# Patient Record
Sex: Male | Born: 1940 | Race: White | Hispanic: No | Marital: Married
Health system: Southern US, Community
[De-identification: ages and names within clinical notes are randomized; demographics above are authoritative.]

---

## 2015-06-22 DIAGNOSIS — M5137 Other intervertebral disc degeneration, lumbosacral region: Secondary | ICD-10-CM | POA: Diagnosis not present

## 2015-06-22 DIAGNOSIS — M47816 Spondylosis without myelopathy or radiculopathy, lumbar region: Secondary | ICD-10-CM | POA: Diagnosis not present

## 2015-06-22 DIAGNOSIS — M5442 Lumbago with sciatica, left side: Secondary | ICD-10-CM | POA: Diagnosis not present

## 2015-06-24 DIAGNOSIS — M150 Primary generalized (osteo)arthritis: Secondary | ICD-10-CM | POA: Diagnosis not present

## 2015-06-24 DIAGNOSIS — N183 Chronic kidney disease, stage 3 (moderate): Secondary | ICD-10-CM | POA: Diagnosis not present

## 2015-06-24 DIAGNOSIS — I129 Hypertensive chronic kidney disease with stage 1 through stage 4 chronic kidney disease, or unspecified chronic kidney disease: Secondary | ICD-10-CM | POA: Diagnosis not present

## 2015-06-24 DIAGNOSIS — E1122 Type 2 diabetes mellitus with diabetic chronic kidney disease: Secondary | ICD-10-CM | POA: Diagnosis not present

## 2015-06-24 DIAGNOSIS — E782 Mixed hyperlipidemia: Secondary | ICD-10-CM | POA: Diagnosis not present

## 2015-06-24 DIAGNOSIS — K219 Gastro-esophageal reflux disease without esophagitis: Secondary | ICD-10-CM | POA: Diagnosis not present

## 2015-07-20 DIAGNOSIS — E1122 Type 2 diabetes mellitus with diabetic chronic kidney disease: Secondary | ICD-10-CM | POA: Diagnosis not present

## 2015-07-20 DIAGNOSIS — K219 Gastro-esophageal reflux disease without esophagitis: Secondary | ICD-10-CM | POA: Diagnosis not present

## 2015-07-20 DIAGNOSIS — N183 Chronic kidney disease, stage 3 (moderate): Secondary | ICD-10-CM | POA: Diagnosis not present

## 2015-07-20 DIAGNOSIS — I129 Hypertensive chronic kidney disease with stage 1 through stage 4 chronic kidney disease, or unspecified chronic kidney disease: Secondary | ICD-10-CM | POA: Diagnosis not present

## 2015-07-20 DIAGNOSIS — E119 Type 2 diabetes mellitus without complications: Secondary | ICD-10-CM | POA: Diagnosis not present

## 2015-07-20 DIAGNOSIS — Z79899 Other long term (current) drug therapy: Secondary | ICD-10-CM | POA: Diagnosis not present

## 2015-07-20 DIAGNOSIS — R110 Nausea: Secondary | ICD-10-CM | POA: Diagnosis not present

## 2015-07-20 DIAGNOSIS — I10 Essential (primary) hypertension: Secondary | ICD-10-CM | POA: Diagnosis not present

## 2015-07-20 DIAGNOSIS — K5909 Other constipation: Secondary | ICD-10-CM | POA: Diagnosis not present

## 2015-09-27 DIAGNOSIS — R1310 Dysphagia, unspecified: Secondary | ICD-10-CM | POA: Diagnosis not present

## 2015-09-27 DIAGNOSIS — N183 Chronic kidney disease, stage 3 (moderate): Secondary | ICD-10-CM | POA: Diagnosis not present

## 2015-09-27 DIAGNOSIS — K219 Gastro-esophageal reflux disease without esophagitis: Secondary | ICD-10-CM | POA: Diagnosis not present

## 2015-09-27 DIAGNOSIS — Z79899 Other long term (current) drug therapy: Secondary | ICD-10-CM | POA: Diagnosis not present

## 2015-09-27 DIAGNOSIS — E1122 Type 2 diabetes mellitus with diabetic chronic kidney disease: Secondary | ICD-10-CM | POA: Diagnosis not present

## 2015-10-04 DIAGNOSIS — R1310 Dysphagia, unspecified: Secondary | ICD-10-CM | POA: Diagnosis not present

## 2015-10-04 DIAGNOSIS — K219 Gastro-esophageal reflux disease without esophagitis: Secondary | ICD-10-CM | POA: Diagnosis not present

## 2015-10-08 DIAGNOSIS — K449 Diaphragmatic hernia without obstruction or gangrene: Secondary | ICD-10-CM | POA: Diagnosis not present

## 2015-10-08 DIAGNOSIS — Z8546 Personal history of malignant neoplasm of prostate: Secondary | ICD-10-CM | POA: Diagnosis not present

## 2015-10-08 DIAGNOSIS — K219 Gastro-esophageal reflux disease without esophagitis: Secondary | ICD-10-CM | POA: Diagnosis not present

## 2015-10-08 DIAGNOSIS — K2270 Barrett's esophagus without dysplasia: Secondary | ICD-10-CM | POA: Diagnosis not present

## 2015-10-08 DIAGNOSIS — Z7982 Long term (current) use of aspirin: Secondary | ICD-10-CM | POA: Diagnosis not present

## 2015-10-08 DIAGNOSIS — Z79899 Other long term (current) drug therapy: Secondary | ICD-10-CM | POA: Diagnosis not present

## 2015-10-08 DIAGNOSIS — R1310 Dysphagia, unspecified: Secondary | ICD-10-CM | POA: Diagnosis not present

## 2015-10-08 DIAGNOSIS — I10 Essential (primary) hypertension: Secondary | ICD-10-CM | POA: Diagnosis not present

## 2015-11-10 DIAGNOSIS — K219 Gastro-esophageal reflux disease without esophagitis: Secondary | ICD-10-CM | POA: Diagnosis not present

## 2015-11-10 DIAGNOSIS — R1310 Dysphagia, unspecified: Secondary | ICD-10-CM | POA: Diagnosis not present

## 2015-11-12 DIAGNOSIS — R1310 Dysphagia, unspecified: Secondary | ICD-10-CM | POA: Diagnosis not present

## 2015-11-17 DIAGNOSIS — T17998D Other foreign object in respiratory tract, part unspecified causing other injury, subsequent encounter: Secondary | ICD-10-CM | POA: Diagnosis not present

## 2015-11-17 DIAGNOSIS — R1310 Dysphagia, unspecified: Secondary | ICD-10-CM | POA: Diagnosis not present

## 2015-11-17 DIAGNOSIS — X58XXXA Exposure to other specified factors, initial encounter: Secondary | ICD-10-CM | POA: Diagnosis not present

## 2015-12-17 DIAGNOSIS — E782 Mixed hyperlipidemia: Secondary | ICD-10-CM | POA: Diagnosis not present

## 2015-12-17 DIAGNOSIS — I129 Hypertensive chronic kidney disease with stage 1 through stage 4 chronic kidney disease, or unspecified chronic kidney disease: Secondary | ICD-10-CM | POA: Diagnosis not present

## 2015-12-17 DIAGNOSIS — Z79899 Other long term (current) drug therapy: Secondary | ICD-10-CM | POA: Diagnosis not present

## 2015-12-17 DIAGNOSIS — T17910D Gastric contents in respiratory tract, part unspecified causing asphyxiation, subsequent encounter: Secondary | ICD-10-CM | POA: Diagnosis not present

## 2015-12-17 DIAGNOSIS — K22710 Barrett's esophagus with low grade dysplasia: Secondary | ICD-10-CM | POA: Diagnosis not present

## 2015-12-17 DIAGNOSIS — Z8546 Personal history of malignant neoplasm of prostate: Secondary | ICD-10-CM | POA: Diagnosis not present

## 2015-12-17 DIAGNOSIS — N183 Chronic kidney disease, stage 3 (moderate): Secondary | ICD-10-CM | POA: Diagnosis not present

## 2015-12-17 DIAGNOSIS — E1122 Type 2 diabetes mellitus with diabetic chronic kidney disease: Secondary | ICD-10-CM | POA: Diagnosis not present

## 2015-12-17 DIAGNOSIS — Z0000 Encounter for general adult medical examination without abnormal findings: Secondary | ICD-10-CM | POA: Diagnosis not present

## 2015-12-17 DIAGNOSIS — K219 Gastro-esophageal reflux disease without esophagitis: Secondary | ICD-10-CM | POA: Diagnosis not present

## 2015-12-17 DIAGNOSIS — K5909 Other constipation: Secondary | ICD-10-CM | POA: Diagnosis not present

## 2015-12-17 DIAGNOSIS — Z23 Encounter for immunization: Secondary | ICD-10-CM | POA: Diagnosis not present

## 2015-12-17 DIAGNOSIS — M150 Primary generalized (osteo)arthritis: Secondary | ICD-10-CM | POA: Diagnosis not present

## 2016-05-09 DIAGNOSIS — S4991XA Unspecified injury of right shoulder and upper arm, initial encounter: Secondary | ICD-10-CM | POA: Diagnosis not present

## 2016-05-10 DIAGNOSIS — S4982XA Other specified injuries of left shoulder and upper arm, initial encounter: Secondary | ICD-10-CM | POA: Diagnosis not present

## 2016-05-18 DIAGNOSIS — S4982XA Other specified injuries of left shoulder and upper arm, initial encounter: Secondary | ICD-10-CM | POA: Diagnosis not present

## 2016-05-19 DIAGNOSIS — L300 Nummular dermatitis: Secondary | ICD-10-CM | POA: Diagnosis not present

## 2016-05-19 DIAGNOSIS — L280 Lichen simplex chronicus: Secondary | ICD-10-CM | POA: Diagnosis not present

## 2016-05-24 DIAGNOSIS — M25512 Pain in left shoulder: Secondary | ICD-10-CM | POA: Diagnosis not present

## 2016-05-24 DIAGNOSIS — S46012D Strain of muscle(s) and tendon(s) of the rotator cuff of left shoulder, subsequent encounter: Secondary | ICD-10-CM | POA: Diagnosis not present

## 2016-05-25 DIAGNOSIS — N183 Chronic kidney disease, stage 3 (moderate): Secondary | ICD-10-CM | POA: Diagnosis not present

## 2016-05-25 DIAGNOSIS — Z01818 Encounter for other preprocedural examination: Secondary | ICD-10-CM | POA: Diagnosis not present

## 2016-05-25 DIAGNOSIS — I129 Hypertensive chronic kidney disease with stage 1 through stage 4 chronic kidney disease, or unspecified chronic kidney disease: Secondary | ICD-10-CM | POA: Diagnosis not present

## 2016-05-25 DIAGNOSIS — E1122 Type 2 diabetes mellitus with diabetic chronic kidney disease: Secondary | ICD-10-CM | POA: Diagnosis not present

## 2016-05-25 DIAGNOSIS — R9431 Abnormal electrocardiogram [ECG] [EKG]: Secondary | ICD-10-CM | POA: Diagnosis not present

## 2016-05-25 DIAGNOSIS — Z79899 Other long term (current) drug therapy: Secondary | ICD-10-CM | POA: Diagnosis not present

## 2016-05-29 DIAGNOSIS — Z01810 Encounter for preprocedural cardiovascular examination: Secondary | ICD-10-CM | POA: Diagnosis not present

## 2016-06-01 DIAGNOSIS — R9431 Abnormal electrocardiogram [ECG] [EKG]: Secondary | ICD-10-CM | POA: Diagnosis not present

## 2016-06-01 DIAGNOSIS — R079 Chest pain, unspecified: Secondary | ICD-10-CM | POA: Diagnosis not present

## 2016-06-23 DIAGNOSIS — G8918 Other acute postprocedural pain: Secondary | ICD-10-CM | POA: Diagnosis not present

## 2016-06-23 DIAGNOSIS — M659 Synovitis and tenosynovitis, unspecified: Secondary | ICD-10-CM | POA: Diagnosis not present

## 2016-06-23 DIAGNOSIS — M75112 Incomplete rotator cuff tear or rupture of left shoulder, not specified as traumatic: Secondary | ICD-10-CM | POA: Diagnosis not present

## 2016-06-23 DIAGNOSIS — I890 Lymphedema, not elsewhere classified: Secondary | ICD-10-CM | POA: Diagnosis not present

## 2016-06-23 DIAGNOSIS — M25512 Pain in left shoulder: Secondary | ICD-10-CM | POA: Diagnosis not present

## 2016-06-23 DIAGNOSIS — M7522 Bicipital tendinitis, left shoulder: Secondary | ICD-10-CM | POA: Diagnosis not present

## 2016-06-23 DIAGNOSIS — M75122 Complete rotator cuff tear or rupture of left shoulder, not specified as traumatic: Secondary | ICD-10-CM | POA: Diagnosis not present

## 2016-06-23 DIAGNOSIS — M24112 Other articular cartilage disorders, left shoulder: Secondary | ICD-10-CM | POA: Diagnosis not present

## 2016-06-23 DIAGNOSIS — S46092A Other injury of muscle(s) and tendon(s) of the rotator cuff of left shoulder, initial encounter: Secondary | ICD-10-CM | POA: Diagnosis not present

## 2016-06-23 DIAGNOSIS — M19012 Primary osteoarthritis, left shoulder: Secondary | ICD-10-CM | POA: Diagnosis not present

## 2016-06-23 DIAGNOSIS — M7542 Impingement syndrome of left shoulder: Secondary | ICD-10-CM | POA: Diagnosis not present

## 2016-08-08 DIAGNOSIS — M25612 Stiffness of left shoulder, not elsewhere classified: Secondary | ICD-10-CM | POA: Diagnosis not present

## 2016-08-10 DIAGNOSIS — M25612 Stiffness of left shoulder, not elsewhere classified: Secondary | ICD-10-CM | POA: Diagnosis not present

## 2016-08-15 DIAGNOSIS — M25612 Stiffness of left shoulder, not elsewhere classified: Secondary | ICD-10-CM | POA: Diagnosis not present

## 2016-08-17 DIAGNOSIS — M25612 Stiffness of left shoulder, not elsewhere classified: Secondary | ICD-10-CM | POA: Diagnosis not present

## 2016-08-22 DIAGNOSIS — M25612 Stiffness of left shoulder, not elsewhere classified: Secondary | ICD-10-CM | POA: Diagnosis not present

## 2016-08-25 DIAGNOSIS — M25612 Stiffness of left shoulder, not elsewhere classified: Secondary | ICD-10-CM | POA: Diagnosis not present

## 2016-08-28 DIAGNOSIS — M25612 Stiffness of left shoulder, not elsewhere classified: Secondary | ICD-10-CM | POA: Diagnosis not present

## 2016-08-31 DIAGNOSIS — M25612 Stiffness of left shoulder, not elsewhere classified: Secondary | ICD-10-CM | POA: Diagnosis not present

## 2016-09-04 DIAGNOSIS — M25612 Stiffness of left shoulder, not elsewhere classified: Secondary | ICD-10-CM | POA: Diagnosis not present

## 2016-09-07 DIAGNOSIS — M25612 Stiffness of left shoulder, not elsewhere classified: Secondary | ICD-10-CM | POA: Diagnosis not present

## 2016-09-12 DIAGNOSIS — M25612 Stiffness of left shoulder, not elsewhere classified: Secondary | ICD-10-CM | POA: Diagnosis not present

## 2016-09-14 DIAGNOSIS — M25612 Stiffness of left shoulder, not elsewhere classified: Secondary | ICD-10-CM | POA: Diagnosis not present

## 2016-09-19 DIAGNOSIS — M25612 Stiffness of left shoulder, not elsewhere classified: Secondary | ICD-10-CM | POA: Diagnosis not present

## 2016-09-21 DIAGNOSIS — M25612 Stiffness of left shoulder, not elsewhere classified: Secondary | ICD-10-CM | POA: Diagnosis not present

## 2016-09-26 DIAGNOSIS — M25612 Stiffness of left shoulder, not elsewhere classified: Secondary | ICD-10-CM | POA: Diagnosis not present

## 2016-09-28 DIAGNOSIS — M25612 Stiffness of left shoulder, not elsewhere classified: Secondary | ICD-10-CM | POA: Diagnosis not present

## 2016-10-02 DIAGNOSIS — Z4789 Encounter for other orthopedic aftercare: Secondary | ICD-10-CM | POA: Diagnosis not present

## 2016-10-02 DIAGNOSIS — S46012D Strain of muscle(s) and tendon(s) of the rotator cuff of left shoulder, subsequent encounter: Secondary | ICD-10-CM | POA: Diagnosis not present

## 2016-11-14 DIAGNOSIS — Z23 Encounter for immunization: Secondary | ICD-10-CM | POA: Diagnosis not present

## 2016-11-16 DIAGNOSIS — L300 Nummular dermatitis: Secondary | ICD-10-CM | POA: Diagnosis not present

## 2016-11-16 DIAGNOSIS — L299 Pruritus, unspecified: Secondary | ICD-10-CM | POA: Diagnosis not present

## 2016-11-16 DIAGNOSIS — L820 Inflamed seborrheic keratosis: Secondary | ICD-10-CM | POA: Diagnosis not present

## 2016-11-20 DIAGNOSIS — K219 Gastro-esophageal reflux disease without esophagitis: Secondary | ICD-10-CM | POA: Diagnosis not present

## 2016-11-20 DIAGNOSIS — R1310 Dysphagia, unspecified: Secondary | ICD-10-CM | POA: Diagnosis not present

## 2016-12-01 DIAGNOSIS — K2270 Barrett's esophagus without dysplasia: Secondary | ICD-10-CM | POA: Diagnosis not present

## 2016-12-01 DIAGNOSIS — Z7982 Long term (current) use of aspirin: Secondary | ICD-10-CM | POA: Diagnosis not present

## 2016-12-01 DIAGNOSIS — K449 Diaphragmatic hernia without obstruction or gangrene: Secondary | ICD-10-CM | POA: Diagnosis not present

## 2016-12-01 DIAGNOSIS — K2970 Gastritis, unspecified, without bleeding: Secondary | ICD-10-CM | POA: Diagnosis not present

## 2016-12-01 DIAGNOSIS — I10 Essential (primary) hypertension: Secondary | ICD-10-CM | POA: Diagnosis not present

## 2016-12-01 DIAGNOSIS — K219 Gastro-esophageal reflux disease without esophagitis: Secondary | ICD-10-CM | POA: Diagnosis not present

## 2016-12-01 DIAGNOSIS — R1310 Dysphagia, unspecified: Secondary | ICD-10-CM | POA: Diagnosis not present

## 2016-12-01 DIAGNOSIS — Z8546 Personal history of malignant neoplasm of prostate: Secondary | ICD-10-CM | POA: Diagnosis not present

## 2016-12-01 DIAGNOSIS — K208 Other esophagitis: Secondary | ICD-10-CM | POA: Diagnosis not present

## 2016-12-01 DIAGNOSIS — Z79899 Other long term (current) drug therapy: Secondary | ICD-10-CM | POA: Diagnosis not present

## 2016-12-01 DIAGNOSIS — Z9049 Acquired absence of other specified parts of digestive tract: Secondary | ICD-10-CM | POA: Diagnosis not present

## 2016-12-04 DIAGNOSIS — K219 Gastro-esophageal reflux disease without esophagitis: Secondary | ICD-10-CM | POA: Diagnosis not present

## 2016-12-04 DIAGNOSIS — N183 Chronic kidney disease, stage 3 (moderate): Secondary | ICD-10-CM | POA: Diagnosis not present

## 2016-12-04 DIAGNOSIS — Z79899 Other long term (current) drug therapy: Secondary | ICD-10-CM | POA: Diagnosis not present

## 2016-12-04 DIAGNOSIS — K5909 Other constipation: Secondary | ICD-10-CM | POA: Diagnosis not present

## 2016-12-04 DIAGNOSIS — E1122 Type 2 diabetes mellitus with diabetic chronic kidney disease: Secondary | ICD-10-CM | POA: Diagnosis not present

## 2016-12-04 DIAGNOSIS — K22710 Barrett's esophagus with low grade dysplasia: Secondary | ICD-10-CM | POA: Diagnosis not present

## 2016-12-04 DIAGNOSIS — Z0000 Encounter for general adult medical examination without abnormal findings: Secondary | ICD-10-CM | POA: Diagnosis not present

## 2016-12-04 DIAGNOSIS — I129 Hypertensive chronic kidney disease with stage 1 through stage 4 chronic kidney disease, or unspecified chronic kidney disease: Secondary | ICD-10-CM | POA: Diagnosis not present

## 2016-12-04 DIAGNOSIS — Z8546 Personal history of malignant neoplasm of prostate: Secondary | ICD-10-CM | POA: Diagnosis not present

## 2017-01-16 DIAGNOSIS — L299 Pruritus, unspecified: Secondary | ICD-10-CM | POA: Diagnosis not present

## 2017-01-16 DIAGNOSIS — L300 Nummular dermatitis: Secondary | ICD-10-CM | POA: Diagnosis not present

## 2017-01-16 DIAGNOSIS — L301 Dyshidrosis [pompholyx]: Secondary | ICD-10-CM | POA: Diagnosis not present

## 2017-02-24 ENCOUNTER — Emergency Department (HOSPITAL_COMMUNITY)
Admission: EM | Admit: 2017-02-24 | Discharge: 2017-02-24 | Disposition: A | Discharge status: Home / Self Care | Payer: PPO | Attending: Emergency Medicine | Admitting: Emergency Medicine

## 2017-02-24 ENCOUNTER — Emergency Department (HOSPITAL_COMMUNITY): Payer: PPO

## 2017-02-24 DIAGNOSIS — R292 Abnormal reflex: Secondary | ICD-10-CM

## 2017-02-24 DIAGNOSIS — G510 Bell's palsy: Secondary | ICD-10-CM

## 2017-02-24 DIAGNOSIS — Z7982 Long term (current) use of aspirin: Secondary | ICD-10-CM | POA: Diagnosis not present

## 2017-02-24 DIAGNOSIS — R29810 Facial weakness: Secondary | ICD-10-CM

## 2017-02-24 DIAGNOSIS — R4781 Slurred speech: Secondary | ICD-10-CM | POA: Insufficient documentation

## 2017-02-24 DIAGNOSIS — Z79899 Other long term (current) drug therapy: Secondary | ICD-10-CM | POA: Diagnosis not present

## 2017-02-24 DIAGNOSIS — R29818 Other symptoms and signs involving the nervous system: Secondary | ICD-10-CM | POA: Diagnosis not present

## 2017-02-24 LAB — DIFFERENTIAL
BASOS ABS: 0 10*3/uL (ref 0.0–0.1)
BASOS PCT: 0 %
EOS ABS: 0.1 10*3/uL (ref 0.0–0.7)
Eosinophils Relative: 1 %
LYMPHS ABS: 1.7 10*3/uL (ref 0.7–4.0)
Lymphocytes Relative: 21 %
MONOS PCT: 11 %
Monocytes Absolute: 0.9 10*3/uL (ref 0.1–1.0)
NEUTROS ABS: 5.3 10*3/uL (ref 1.7–7.7)
NEUTROS PCT: 67 %

## 2017-02-24 LAB — CBG MONITORING, ED: Glucose-Capillary: 142 mg/dL — ABNORMAL HIGH (ref 65–99)

## 2017-02-24 LAB — CBC
HEMATOCRIT: 46.7 % (ref 39.0–52.0)
HEMOGLOBIN: 16.2 g/dL (ref 13.0–17.0)
MCH: 32.7 pg (ref 26.0–34.0)
MCHC: 34.7 g/dL (ref 30.0–36.0)
MCV: 94.2 fL (ref 78.0–100.0)
Platelets: 125 10*3/uL — ABNORMAL LOW (ref 150–400)
RBC: 4.96 MIL/uL (ref 4.22–5.81)
RDW: 14 % (ref 11.5–15.5)
WBC: 8 10*3/uL (ref 4.0–10.5)

## 2017-02-24 LAB — PROTIME-INR
INR: 1.19
Prothrombin Time: 15 seconds (ref 11.4–15.2)

## 2017-02-24 LAB — COMPREHENSIVE METABOLIC PANEL
ALBUMIN: 3.9 g/dL (ref 3.5–5.0)
ALT: 31 U/L (ref 17–63)
AST: 27 U/L (ref 15–41)
Alkaline Phosphatase: 59 U/L (ref 38–126)
Anion gap: 9 (ref 5–15)
BILIRUBIN TOTAL: 0.8 mg/dL (ref 0.3–1.2)
BUN: 14 mg/dL (ref 6–20)
CHLORIDE: 98 mmol/L — AB (ref 101–111)
CO2: 27 mmol/L (ref 22–32)
CREATININE: 1.01 mg/dL (ref 0.61–1.24)
Calcium: 8.8 mg/dL — ABNORMAL LOW (ref 8.9–10.3)
GFR calc Af Amer: >60 mL/min (ref >60)
GFR calc non Af Amer: >60 mL/min (ref >60)
GLUCOSE: 141 mg/dL — AB (ref 65–99)
POTASSIUM: 3.5 mmol/L (ref 3.5–5.1)
Sodium: 134 mmol/L — ABNORMAL LOW (ref 135–145)
Total Protein: 6.7 g/dL (ref 6.5–8.1)

## 2017-02-24 LAB — I-STAT CHEM 8, ED
BUN: 16 mg/dL (ref 6–20)
CHLORIDE: 97 mmol/L — AB (ref 101–111)
CREATININE: 1 mg/dL (ref 0.61–1.24)
Calcium, Ion: 1.05 mmol/L — ABNORMAL LOW (ref 1.15–1.40)
GLUCOSE: 144 mg/dL — AB (ref 65–99)
HEMATOCRIT: 49 % (ref 39.0–52.0)
HEMOGLOBIN: 16.7 g/dL (ref 13.0–17.0)
POTASSIUM: 3.6 mmol/L (ref 3.5–5.1)
Sodium: 137 mmol/L (ref 135–145)
TCO2: 26 mmol/L (ref 22–32)

## 2017-02-24 LAB — I-STAT TROPONIN, ED: Troponin i, poc: 0 ng/mL (ref 0.00–0.08)

## 2017-02-24 LAB — APTT: APTT: 29 s (ref 24–36)

## 2017-02-24 LAB — ETHANOL: Alcohol, Ethyl (B): <10 mg/dL (ref <10)

## 2017-02-24 MED ORDER — VALACYCLOVIR HCL 1 G PO TABS: 1000.0000 mg | ORAL_TABLET | Freq: Three times a day (TID) | ORAL | 0 refills | Status: AC

## 2017-02-24 MED ORDER — PREDNISONE 50 MG PO TABS: ORAL_TABLET | ORAL | 0 refills | Status: AC

## 2017-02-24 NOTE — ED Triage Notes (Signed)
Pt arrives to Triage with obvious left sided facial droop that started he states around 12pm. Pt states he had a slight headache since yesterday afternoon with right eye pain pt has sty. Pt states at 12pm he just didn't feel well and wife noticed he looked different. MD Angelina Pih called to triage to evaluated.

## 2017-02-24 NOTE — Consult Note (Signed)
NEURO HOSPITALIST CONSULT NOTE   Requestig physician: Dr. Reather Converse  Reason for Consult: Left facial droop  History obtained from:  Patient and Chart    HPI:                                                                                                                                          David Foley is an 76 y.o. male who presented to Triage with his wife after she noticed a left sided facial droop at about noon today. He states his first symptom was present on awakening this morning, which consisted of left eye irritation. He has had a slight headache since yesterday afternoon with right eye pain that he attributed to a stye he has been treating. He has a history of chicken pox as a young man and also shingles. He denies left ear drainage, pain or hearing loss. Also denies limb weakness or limb sensory numbness. No confusion or aphasia. States that he has started to "feel numb" in the lip on the left side of his mouth since arriving to the ED. Code Stroke was called by Dr. Reather Converse.   No past medical history on file.  No family history on file.  Social History:  has no tobacco, alcohol, and drug history on file.  Allergies not on file  MEDICATIONS:                                                                                                                        ROS:  No double vision or vision loss. Other ROS as per HPI.   Blood pressure (!) 171/77, pulse 70, temperature 98.8 F (37.1 C), temperature source Oral, resp. rate 16, SpO2 100 %.  General Examination:                                                                                                      HEENT-  /AT   Lungs- Respirations unlabored Extremities- No edema  Neurological Examination Mental Status: Alert, oriented, thought content appropriate. Speech  fluent without evidence of aphasia. No dysarthria. Able to follow all commands without difficulty. Cranial Nerves: II: Visual fields intact. PERRL.   III,IV, VI: EOMI without nystagmus or double vision. Left lid wider than right, with weak eyelid closure on the left also noted.  V,VII: Left facial droop lower and upper quadrants. Decreased brow furrowing on left. Facial temp sensation equal bilaterally. VIII: Hearing intact to voice.  IX,X: Palate rises symmetrically. Uvula midline.  XI: Symmetric XII: midline tongue extension Motor: Right : Upper extremity   5/5    Left:     Upper extremity   5/5  Lower extremity   5/5     Lower extremity   5/5 Normal tone throughout; no atrophy noted Sensory: Temp and light touch intact x 4 without extinction.  Deep Tendon Reflexes: 2+ and symmetric throughout with the exception of 4+ left patellar (crossed adductor response). Right toe downgoing, left toe upgoing. Hoffman's negative on right, positive on left.  Cerebellar: No ataxia with FNF bilaterally.  Gait: Deferred  Lab Results: Basic Metabolic Panel: No results for input(s): NA, K, CL, CO2, GLUCOSE, BUN, CREATININE, CALCIUM, MG, PHOS in the last 168 hours.  Liver Function Tests: No results for input(s): AST, ALT, ALKPHOS, BILITOT, PROT, ALBUMIN in the last 168 hours. No results for input(s): LIPASE, AMYLASE in the last 168 hours. No results for input(s): AMMONIA in the last 168 hours.  CBC: No results for input(s): WBC, NEUTROABS, HGB, HCT, MCV, PLT in the last 168 hours.  Cardiac Enzymes: No results for input(s): CKTOTAL, CKMB, CKMBINDEX, TROPONINI in the last 168 hours.  Lipid Panel: No results for input(s): CHOL, TRIG, HDL, CHOLHDL, VLDL, LDLCALC in the last 168 hours.  CBG: Recent Labs  Lab 02/24/17 Y[facility]*    Microbiology: No results found for this or any previous visit.  Coagulation Studies: No results for input(s): LABPROT, INR in the last 72  hours.  Imaging: No results found.  Assessment: 76 year old male with new onset of left facial droop affecting the upper and lower quadrants.  1. No limb weakness, sensory deficit or ataxia noted on exam.  2. Overall clinical presentation most consistent with Bell's palsy.  3. CT head shows atrophy and small vessel disease. No acute intracranial findings are noted. 4. 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 No associated motor weakness. No crossed sensory finding to suggest a spinal cord lesion.   Recommendations: 1. MRI brain with and without contrast to assess for  left facial nerve enhancement, left CP angle meningioma or left vestibular Schwannoma.  2. If MRI brain is negative for mass lesion or left vestibular Schwannoma, discharge on a course of prednisone followed by a taper, in conjunction with Valacyclovir as per Up-To-Date recommendations for Bell's palsy treatment.  3. 2-3 week ollow up with PCP.  4. Continue ASA and simvastatin.  Electronically signed: Dr. Kerney Elbe  02/24/2017, 4:42 PM

## 2017-02-24 NOTE — ED Notes (Signed)
Assumed care on pt., pt. currently at MRI .

## 2017-02-24 NOTE — ED Provider Notes (Signed)
[facility] DEPART[facility] Provider Note   CSN: [id number] Arrival date & time: 02/24/17  1622   An emergency department physician performed an initial assessment on this suspected stroke patient at 1634.  History   Chief Complaint Chief Complaint  Patient presents with  . Code Stroke    HPI David Foley is a 76 y.o. male.  Patient with no history of stroke presents with left-sided facial droop that started at 12:00 noon today. Patient had slight headache yesterday and mildirritation of the left eye. Neurologic symptoms deftly started today after lunch. Patient denies other neurologic symptoms. Patient is not on anticoagulant.      No past medical history on file.  There are no active problems to display for this patient.        Home Medications    Prior to Admission medications   Medication Sig Start Date End Date Taking? Authorizing Provider  aspirin EC 81 MG tablet Take 81 mg by mouth daily.   Yes [provider]  fexofenadine (ALLEGRA) 180 MG tablet Take 180 mg by mouth daily.   Yes [provider]  hydrochlorothiazide (MICROZIDE) 12.5 MG capsule Take 12.5 mg by mouth daily. 12/03/16  Yes [provider]  ketoconazole (NIZORAL) 2 % shampoo Apply 1 application topically daily as needed (scalp eczema).   Yes [provider]  losartan (COZAAR) 100 MG tablet Take 100 mg by mouth daily. 01/19/17  Yes [provider]  omeprazole (PRILOSEC) 20 MG capsule Take 20 mg by mouth daily. 12/04/16  Yes [provider]  simvastatin (ZOCOR) 40 MG tablet Take 40 mg by mouth at bedtime. 01/01/17  Yes [provider]  triamcinolone cream (KENALOG) 0.1 % Apply 1 application topically 2 (two) times daily as needed (eczema on hands).   Yes [provider]  predniSONE (DELTASONE) 50 MG tablet bells 02/24/17   Elnora Morrison, MD  valACYclovir (VALTREX) 1000 MG tablet Take 1 tablet (1,000 mg  total) by mouth 3 (three) times daily. 02/24/17   Elnora Morrison, MD    Family History No family history on file.  Social History Social History   Tobacco Use  . Smoking status: Not on file  Substance Use Topics  . Alcohol use: Not on file  . Drug use: Not on file     Allergies   Altace [ramipril]; Amlodipine; Levofloxacin; and Thimerosal   Review of Systems Review of Systems  Constitutional: Negative for chills and fever.  HENT: Negative for congestion.   Eyes: Positive for redness and itching. Negative for photophobia and visual disturbance.  Respiratory: Negative for shortness of breath.   Cardiovascular: Negative for chest pain.  Gastrointestinal: Negative for abdominal pain and vomiting.  Genitourinary: Negative for dysuria and flank pain.  Musculoskeletal: Negative for back pain, neck pain and neck stiffness.  Skin: Negative for rash.  Neurological: Positive for weakness. Negative for seizures, light-headedness and headaches.     Physical Exam Updated Vital Signs BP (!) 163/77 (BP Location: Left Arm)   Pulse 61   Temp 98.5 F (36.9 C)   Resp 18   SpO2 98%   Physical Exam  Constitutional: He is oriented to person, place, and time. He appears well-developed and well-nourished.  HENT:  Head: Normocephalic and atraumatic.  Eyes: Right eye exhibits no discharge. Left eye exhibits no discharge.  Mild conjunctival injection left eye.  Neck: Normal range of motion. Neck supple. No tracheal deviation present.  Cardiovascular: Normal rate and regular rhythm.  Pulmonary/Chest: Effort  normal and breath sounds normal.  Abdominal: Soft. He exhibits no distension. There is no tenderness. There is no guarding.  Musculoskeletal: He exhibits no edema.  Neurological: He is alert and oriented to person, place, and time.  Extraocular muscle function intact. Patient has left-sided facial droop with mild decrease in ability to raise left eyebrow. Patient has 5+ strength upper  and lower extremities without drift. Gross sensation intact bilateral. Finger-nose intact.  Skin: Skin is warm. No rash noted.  Psychiatric: He has a normal mood and affect.  Nursing note and vitals reviewed.    ED Treatments / Results  Labs (all labs ordered are listed, but only abnormal results are displayed) Labs Reviewed  CBC - Abnormal; Notable for the following components:      Result Value   Platelets 125 (*)    All other components within normal limits  COMPREHENSIVE METABOLIC PANEL - Abnormal; Notable for the following components:   Sodium 134 (*)    Chloride 98 (*)    Glucose, Bld 141 (*)    Calcium 8.8 (*)    All other components within normal limits  I-STAT CHEM 8, ED - Abnormal; Notable for the following components:   Chloride 97 (*)    Glucose, Bld 144 (*)    Calcium, Ion 1.05 (*)    All other components within normal limits  CBG MONITORING, ED - Abnormal; Notable for the following components:   Glucose-Capillary 142 (*)    All other components within normal limits  ETHANOL  PROTIME-INR  APTT  DIFFERENTIAL  RAPID URINE DRUG SCREEN, HOSP PERFORMED  URINALYSIS, ROUTINE W REFLEX MICROSCOPIC  I-STAT TROPONIN, ED    EKG  EKG Interpretation None       Radiology Mr Brain Wo Contrast  Result Date: 02/24/2017 CLINICAL DATA:  Code stroke. Slurred speech and LEFT facial droop. Last seen normal at 12 noon, approximately 7 hours earlier. EXAM: MRI HEAD WITHOUT CONTRAST TECHNIQUE: Multiplanar, multiecho pulse sequences of the brain and surrounding structures were obtained without intravenous contrast. COMPARISON:  CT head performed earlier in the day. FINDINGS: Brain: No evidence for acute infarction, hemorrhage, mass lesion, hydrocephalus, or extra-axial fluid. Normal for age cerebral volume. Mild subcortical and periventricular T2 and FLAIR hyperintensities, likely chronic microvascular ischemic change. Small chronic BILATERAL subcortical lacunar infarcts. Vascular:  Normal flow voids.  No proximal large vessel occlusion. Skull and upper cervical spine: Normal marrow signal. Sinuses/Orbits: Negative. Other: Trace BILATERAL mastoid fluid. IMPRESSION: Negative for acute stroke. Age related changes as described. No proximal large vessel occlusion. Electronically Signed   By: Staci Righter M.D.   On: 02/24/2017 19:41   Ct Head Code Stroke Wo Contrast  Result Date: 02/24/2017 CLINICAL DATA:  Code stroke. Slurred speech and LEFT facial droop. Last seen normal at 12 noon, approximately 5 hours earlier. EXAM: CT HEAD WITHOUT CONTRAST TECHNIQUE: Contiguous axial images were obtained from the base of the skull through the vertex without intravenous contrast. COMPARISON:  None. FINDINGS: Brain: No evidence for acute infarction, hemorrhage, mass lesion, hydrocephalus, or extra-axial fluid. Normal for age cerebral volume. Hypoattenuation of white matter, likely small vessel disease. Vascular: Calcification of the cavernous internal carotid arteries consistent with cerebrovascular atherosclerotic disease. No signs of intracranial large vessel occlusion. Skull: Normal. Negative for fracture or focal lesion. Sinuses/Orbits: No acute finding. Other: None. ASPECTS [facility] Stroke Program Early CT Score) - Ganglionic level infarction (caudate, lentiform nuclei, internal capsule, insula, M1-M3 cortex): 7 - Supraganglionic infarction (M4-M6 cortex): 3 Total score (0-10 with  10 being normal): 10 IMPRESSION: 1. Atrophy and small vessel disease. No acute intracranial findings. 2. ASPECTS is 10. These results were communicated to stroke neurologist at 4:54 pmon 12/22/2018by text page via the [facility] messaging system. Electronically Signed   By: Staci Righter M.D.   On: 02/24/2017 17:02    Procedures .Critical Care Performed by: Elnora Morrison, MD Authorized by: Elnora Morrison, MD   Critical care provider statement:    Critical care time (minutes):  35   Critical care start time:  02/24/2017  6:00 PM   Critical care end time:  02/24/2017 6:35 PM   Critical care time was exclusive of:  Separately billable procedures and treating other patients and teaching time   Critical care was necessary to treat or prevent imminent or life-threatening deterioration of the following conditions:  CNS failure or compromise   Critical care was time spent personally by me on the following activities:  Re-evaluation of patient's condition, examination of patient and discussions with consultants   I assumed direction of critical care for this patient from another provider in my specialty: no     (including critical care time)   Medications Ordered in ED Medications - No data to display   Initial Impression / Assessment and Plan / ED Course  I have reviewed the triage vital signs and the nursing notes.  Pertinent labs & imaging results that were available during my care of the patient were reviewed by me and considered in my medical decision making (see chart for details).    Patient presents with clinical concern for stroke versus Bell's palsy. Patient is within time window of code stroke.  Patient assessed immediately, protecting airway, discussed with neurologist at the bedside. Plan for MRI and if negative treat with antivirals and steroids with close outpatient follow-up. MRI neg stroke. Neuro rec steroids/ antivirals.   Results and differential diagnosis were discussed with the patient/parent/guardian. Xrays were independently reviewed by myself.  Close follow up outpatient was discussed, comfortable with the plan.   Medications - No data to display  Vitals:   02/24/17 1800 02/24/17 1815 02/24/17 1948 02/24/17 1950  BP: (!) 156/72 (!) 169/66 (!) 163/77   Pulse: (!) 59 (!) 56 61   Resp: 16 16 18    Temp:   98.5 F (36.9 C) 98.5 F (36.9 C)  TempSrc:   Oral   SpO2: 99% 99% 98%     Final diagnoses:  Facial droop  Bell's palsy     Final Clinical Impressions(s) / ED Diagnoses    Final diagnoses:  Facial droop  Bell's palsy    ED Discharge Orders        Ordered    valACYclovir (VALTREX) 1000 MG tablet  3 times daily     02/24/17 2002    predniSONE (DELTASONE) 50 MG tablet     02/24/17 2002       Elnora Morrison, MD 02/24/17 2006

## 2017-02-24 NOTE — ED Notes (Signed)
Dr. Jerilee Field explained tests results and discharge plan to pt. and family .

## 2017-02-24 NOTE — Discharge Instructions (Signed)
Purchase saline eye drops to keep eye moist daily.  Use medicines as directed.  If you were given medicines take as directed.  If you are on coumadin or contraceptives realize their levels and effectiveness is altered by many different medicines.  If you have any reaction (rash, tongues swelling, other) to the medicines stop taking and see a physician.    If your blood pressure was elevated in the ER make sure you follow up for management with a primary doctor or return for chest pain, shortness of breath or stroke symptoms.  Please follow up as directed and return to the ER or see a physician for new or worsening symptoms.  Thank you. Vitals:   02/24/17 1800 02/24/17 1815 02/24/17 1948 02/24/17 1950  BP: (!) 156/72 (!) 169/66 (!) 163/77   Pulse: (!) 59 (!) 56 61   Resp: 16 16 18    Temp:   98.5 F (36.9 C) 98.5 F (36.9 C)  TempSrc:   Oral   SpO2: 99% 99% 98%

## 2017-03-01 DIAGNOSIS — H5789 Other specified disorders of eye and adnexa: Secondary | ICD-10-CM | POA: Diagnosis not present

## 2017-03-01 DIAGNOSIS — G510 Bell's palsy: Secondary | ICD-10-CM | POA: Diagnosis not present

## 2017-03-02 DIAGNOSIS — H16212 Exposure keratoconjunctivitis, left eye: Secondary | ICD-10-CM | POA: Diagnosis not present

## 2017-03-02 DIAGNOSIS — G510 Bell's palsy: Secondary | ICD-10-CM | POA: Diagnosis not present

## 2017-03-22 DIAGNOSIS — L853 Xerosis cutis: Secondary | ICD-10-CM | POA: Diagnosis not present

## 2017-03-22 DIAGNOSIS — L300 Nummular dermatitis: Secondary | ICD-10-CM | POA: Diagnosis not present

## 2017-03-22 DIAGNOSIS — L299 Pruritus, unspecified: Secondary | ICD-10-CM | POA: Diagnosis not present

## 2017-06-04 DIAGNOSIS — E1122 Type 2 diabetes mellitus with diabetic chronic kidney disease: Secondary | ICD-10-CM | POA: Diagnosis not present

## 2017-06-04 DIAGNOSIS — D696 Thrombocytopenia, unspecified: Secondary | ICD-10-CM | POA: Diagnosis not present

## 2017-06-04 DIAGNOSIS — I129 Hypertensive chronic kidney disease with stage 1 through stage 4 chronic kidney disease, or unspecified chronic kidney disease: Secondary | ICD-10-CM | POA: Diagnosis not present

## 2017-06-04 DIAGNOSIS — Z79899 Other long term (current) drug therapy: Secondary | ICD-10-CM | POA: Diagnosis not present

## 2017-06-04 DIAGNOSIS — E782 Mixed hyperlipidemia: Secondary | ICD-10-CM | POA: Diagnosis not present

## 2017-06-04 DIAGNOSIS — N183 Chronic kidney disease, stage 3 (moderate): Secondary | ICD-10-CM | POA: Diagnosis not present

## 2017-07-03 DIAGNOSIS — L299 Pruritus, unspecified: Secondary | ICD-10-CM | POA: Diagnosis not present

## 2017-07-03 DIAGNOSIS — L0100 Impetigo, unspecified: Secondary | ICD-10-CM | POA: Diagnosis not present

## 2017-07-03 DIAGNOSIS — L300 Nummular dermatitis: Secondary | ICD-10-CM | POA: Diagnosis not present

## 2017-11-19 DIAGNOSIS — R1310 Dysphagia, unspecified: Secondary | ICD-10-CM | POA: Diagnosis not present

## 2017-11-19 DIAGNOSIS — K219 Gastro-esophageal reflux disease without esophagitis: Secondary | ICD-10-CM | POA: Diagnosis not present

## 2017-11-19 DIAGNOSIS — K2970 Gastritis, unspecified, without bleeding: Secondary | ICD-10-CM | POA: Diagnosis not present

## 2017-11-21 DIAGNOSIS — T17308A Unspecified foreign body in larynx causing other injury, initial encounter: Secondary | ICD-10-CM | POA: Diagnosis not present

## 2017-11-21 DIAGNOSIS — T17320A Food in larynx causing asphyxiation, initial encounter: Secondary | ICD-10-CM | POA: Diagnosis not present

## 2017-11-21 DIAGNOSIS — K219 Gastro-esophageal reflux disease without esophagitis: Secondary | ICD-10-CM | POA: Diagnosis not present

## 2017-11-27 DIAGNOSIS — M2570 Osteophyte, unspecified joint: Secondary | ICD-10-CM | POA: Diagnosis not present

## 2017-11-27 DIAGNOSIS — K219 Gastro-esophageal reflux disease without esophagitis: Secondary | ICD-10-CM | POA: Diagnosis not present

## 2017-11-27 DIAGNOSIS — R1314 Dysphagia, pharyngoesophageal phase: Secondary | ICD-10-CM | POA: Diagnosis not present

## 2017-12-04 DIAGNOSIS — Z23 Encounter for immunization: Secondary | ICD-10-CM | POA: Diagnosis not present

## 2017-12-11 DIAGNOSIS — L300 Nummular dermatitis: Secondary | ICD-10-CM | POA: Diagnosis not present

## 2017-12-11 DIAGNOSIS — L299 Pruritus, unspecified: Secondary | ICD-10-CM | POA: Diagnosis not present

## 2017-12-18 DIAGNOSIS — K22710 Barrett's esophagus with low grade dysplasia: Secondary | ICD-10-CM | POA: Diagnosis not present

## 2017-12-18 DIAGNOSIS — E1122 Type 2 diabetes mellitus with diabetic chronic kidney disease: Secondary | ICD-10-CM | POA: Diagnosis not present

## 2017-12-18 DIAGNOSIS — K219 Gastro-esophageal reflux disease without esophagitis: Secondary | ICD-10-CM | POA: Diagnosis not present

## 2017-12-18 DIAGNOSIS — I129 Hypertensive chronic kidney disease with stage 1 through stage 4 chronic kidney disease, or unspecified chronic kidney disease: Secondary | ICD-10-CM | POA: Diagnosis not present

## 2017-12-18 DIAGNOSIS — N183 Chronic kidney disease, stage 3 (moderate): Secondary | ICD-10-CM | POA: Diagnosis not present

## 2017-12-18 DIAGNOSIS — T17908D Unspecified foreign body in respiratory tract, part unspecified causing other injury, subsequent encounter: Secondary | ICD-10-CM | POA: Diagnosis not present

## 2017-12-18 DIAGNOSIS — M150 Primary generalized (osteo)arthritis: Secondary | ICD-10-CM | POA: Diagnosis not present

## 2017-12-18 DIAGNOSIS — D696 Thrombocytopenia, unspecified: Secondary | ICD-10-CM | POA: Diagnosis not present

## 2017-12-18 DIAGNOSIS — L308 Other specified dermatitis: Secondary | ICD-10-CM | POA: Diagnosis not present

## 2017-12-18 DIAGNOSIS — K5909 Other constipation: Secondary | ICD-10-CM | POA: Diagnosis not present

## 2017-12-18 DIAGNOSIS — Z0000 Encounter for general adult medical examination without abnormal findings: Secondary | ICD-10-CM | POA: Diagnosis not present

## 2017-12-18 DIAGNOSIS — E782 Mixed hyperlipidemia: Secondary | ICD-10-CM | POA: Diagnosis not present

## 2017-12-25 DIAGNOSIS — Z79899 Other long term (current) drug therapy: Secondary | ICD-10-CM | POA: Diagnosis not present

## 2017-12-25 DIAGNOSIS — D696 Thrombocytopenia, unspecified: Secondary | ICD-10-CM | POA: Diagnosis not present

## 2017-12-25 DIAGNOSIS — Z8546 Personal history of malignant neoplasm of prostate: Secondary | ICD-10-CM | POA: Diagnosis not present

## 2017-12-25 DIAGNOSIS — I129 Hypertensive chronic kidney disease with stage 1 through stage 4 chronic kidney disease, or unspecified chronic kidney disease: Secondary | ICD-10-CM | POA: Diagnosis not present

## 2017-12-25 DIAGNOSIS — E1122 Type 2 diabetes mellitus with diabetic chronic kidney disease: Secondary | ICD-10-CM | POA: Diagnosis not present

## 2017-12-25 DIAGNOSIS — N183 Chronic kidney disease, stage 3 (moderate): Secondary | ICD-10-CM | POA: Diagnosis not present

## 2017-12-25 DIAGNOSIS — E782 Mixed hyperlipidemia: Secondary | ICD-10-CM | POA: Diagnosis not present

## 2018-01-05 DIAGNOSIS — L300 Nummular dermatitis: Secondary | ICD-10-CM | POA: Diagnosis not present

## 2018-02-05 DIAGNOSIS — R21 Rash and other nonspecific skin eruption: Secondary | ICD-10-CM | POA: Diagnosis not present

## 2018-02-05 DIAGNOSIS — L2089 Other atopic dermatitis: Secondary | ICD-10-CM | POA: Diagnosis not present

## 2018-02-18 DIAGNOSIS — R531 Weakness: Secondary | ICD-10-CM | POA: Diagnosis not present

## 2018-04-09 DIAGNOSIS — L300 Nummular dermatitis: Secondary | ICD-10-CM | POA: Diagnosis not present

## 2018-04-09 DIAGNOSIS — L299 Pruritus, unspecified: Secondary | ICD-10-CM | POA: Diagnosis not present

## 2018-07-09 DIAGNOSIS — L300 Nummular dermatitis: Secondary | ICD-10-CM | POA: Diagnosis not present

## 2018-07-09 DIAGNOSIS — L853 Xerosis cutis: Secondary | ICD-10-CM | POA: Diagnosis not present

## 2018-07-09 DIAGNOSIS — L299 Pruritus, unspecified: Secondary | ICD-10-CM | POA: Diagnosis not present

## 2018-07-09 DIAGNOSIS — R531 Weakness: Secondary | ICD-10-CM | POA: Diagnosis not present

## 2018-08-14 DIAGNOSIS — K219 Gastro-esophageal reflux disease without esophagitis: Secondary | ICD-10-CM | POA: Diagnosis not present

## 2018-08-14 DIAGNOSIS — R5383 Other fatigue: Secondary | ICD-10-CM | POA: Diagnosis not present

## 2018-08-14 DIAGNOSIS — D696 Thrombocytopenia, unspecified: Secondary | ICD-10-CM | POA: Diagnosis not present

## 2018-08-14 DIAGNOSIS — K22710 Barrett's esophagus with low grade dysplasia: Secondary | ICD-10-CM | POA: Diagnosis not present

## 2018-08-14 DIAGNOSIS — I129 Hypertensive chronic kidney disease with stage 1 through stage 4 chronic kidney disease, or unspecified chronic kidney disease: Secondary | ICD-10-CM | POA: Diagnosis not present

## 2018-08-14 DIAGNOSIS — E1122 Type 2 diabetes mellitus with diabetic chronic kidney disease: Secondary | ICD-10-CM | POA: Diagnosis not present

## 2018-08-14 DIAGNOSIS — R5381 Other malaise: Secondary | ICD-10-CM | POA: Diagnosis not present

## 2018-08-14 DIAGNOSIS — N183 Chronic kidney disease, stage 3 (moderate): Secondary | ICD-10-CM | POA: Diagnosis not present

## 2018-08-14 DIAGNOSIS — E782 Mixed hyperlipidemia: Secondary | ICD-10-CM | POA: Diagnosis not present

## 2018-11-07 DIAGNOSIS — L299 Pruritus, unspecified: Secondary | ICD-10-CM | POA: Diagnosis not present

## 2018-11-07 DIAGNOSIS — L300 Nummular dermatitis: Secondary | ICD-10-CM | POA: Diagnosis not present

## 2018-12-02 DIAGNOSIS — Z23 Encounter for immunization: Secondary | ICD-10-CM | POA: Diagnosis not present

## 2018-12-18 DIAGNOSIS — K219 Gastro-esophageal reflux disease without esophagitis: Secondary | ICD-10-CM | POA: Diagnosis not present

## 2018-12-18 DIAGNOSIS — R1310 Dysphagia, unspecified: Secondary | ICD-10-CM | POA: Diagnosis not present

## 2018-12-18 DIAGNOSIS — K2270 Barrett's esophagus without dysplasia: Secondary | ICD-10-CM | POA: Diagnosis not present

## 2019-02-15 DIAGNOSIS — L300 Nummular dermatitis: Secondary | ICD-10-CM | POA: Diagnosis not present

## 2019-02-25 DIAGNOSIS — E1121 Type 2 diabetes mellitus with diabetic nephropathy: Secondary | ICD-10-CM | POA: Diagnosis not present

## 2019-02-25 DIAGNOSIS — K22710 Barrett's esophagus with low grade dysplasia: Secondary | ICD-10-CM | POA: Diagnosis not present

## 2019-02-25 DIAGNOSIS — I129 Hypertensive chronic kidney disease with stage 1 through stage 4 chronic kidney disease, or unspecified chronic kidney disease: Secondary | ICD-10-CM | POA: Diagnosis not present

## 2019-02-25 DIAGNOSIS — N1831 Chronic kidney disease, stage 3a: Secondary | ICD-10-CM | POA: Diagnosis not present

## 2019-02-25 DIAGNOSIS — E782 Mixed hyperlipidemia: Secondary | ICD-10-CM | POA: Diagnosis not present

## 2019-02-25 DIAGNOSIS — D696 Thrombocytopenia, unspecified: Secondary | ICD-10-CM | POA: Diagnosis not present

## 2019-02-25 DIAGNOSIS — I10 Essential (primary) hypertension: Secondary | ICD-10-CM | POA: Diagnosis not present

## 2019-02-25 DIAGNOSIS — Z8546 Personal history of malignant neoplasm of prostate: Secondary | ICD-10-CM | POA: Diagnosis not present

## 2019-02-25 DIAGNOSIS — K219 Gastro-esophageal reflux disease without esophagitis: Secondary | ICD-10-CM | POA: Diagnosis not present

## 2019-02-25 DIAGNOSIS — L308 Other specified dermatitis: Secondary | ICD-10-CM | POA: Diagnosis not present

## 2019-02-25 DIAGNOSIS — K5909 Other constipation: Secondary | ICD-10-CM | POA: Diagnosis not present

## 2019-02-25 DIAGNOSIS — Z0000 Encounter for general adult medical examination without abnormal findings: Secondary | ICD-10-CM | POA: Diagnosis not present

## 2019-02-25 DIAGNOSIS — M8949 Other hypertrophic osteoarthropathy, multiple sites: Secondary | ICD-10-CM | POA: Diagnosis not present

## 2019-03-29 DIAGNOSIS — L723 Sebaceous cyst: Secondary | ICD-10-CM | POA: Diagnosis not present

## 2019-05-27 DIAGNOSIS — L299 Pruritus, unspecified: Secondary | ICD-10-CM | POA: Diagnosis not present

## 2019-05-27 DIAGNOSIS — L720 Epidermal cyst: Secondary | ICD-10-CM | POA: Diagnosis not present

## 2019-05-27 DIAGNOSIS — L300 Nummular dermatitis: Secondary | ICD-10-CM | POA: Diagnosis not present

## 2019-09-12 DIAGNOSIS — N1831 Chronic kidney disease, stage 3a: Secondary | ICD-10-CM | POA: Diagnosis not present

## 2019-09-12 DIAGNOSIS — I129 Hypertensive chronic kidney disease with stage 1 through stage 4 chronic kidney disease, or unspecified chronic kidney disease: Secondary | ICD-10-CM | POA: Diagnosis not present

## 2019-09-12 DIAGNOSIS — M19041 Primary osteoarthritis, right hand: Secondary | ICD-10-CM | POA: Diagnosis not present

## 2019-09-12 DIAGNOSIS — D696 Thrombocytopenia, unspecified: Secondary | ICD-10-CM | POA: Diagnosis not present

## 2019-09-12 DIAGNOSIS — K219 Gastro-esophageal reflux disease without esophagitis: Secondary | ICD-10-CM | POA: Diagnosis not present

## 2019-09-12 DIAGNOSIS — E1122 Type 2 diabetes mellitus with diabetic chronic kidney disease: Secondary | ICD-10-CM | POA: Diagnosis not present

## 2019-09-12 DIAGNOSIS — L989 Disorder of the skin and subcutaneous tissue, unspecified: Secondary | ICD-10-CM | POA: Diagnosis not present

## 2019-09-12 DIAGNOSIS — K22710 Barrett's esophagus with low grade dysplasia: Secondary | ICD-10-CM | POA: Diagnosis not present

## 2019-11-21 DIAGNOSIS — Z23 Encounter for immunization: Secondary | ICD-10-CM | POA: Diagnosis not present

## 2019-11-22 IMAGING — CT CT HEAD CODE STROKE
3 series · 14 of 47 positions shown, 16 images · non-contrast
Comparison: None.

CLINICAL DATA: Code stroke. Slurred speech and LEFT facial droop.
Last seen normal at [REDACTED], approximately 5 hours earlier.

EXAM:
CT HEAD WITHOUT CONTRAST
TECHNIQUE: Contiguous axial images were obtained from the base of the skull
through the vertex without intravenous contrast.

[Series 3: head 5.0 st · axial · 0.43mm/px · z∈[-87,+43]mm · 8 of 32 slices shown, 10 images]
[im 3/32  brain]
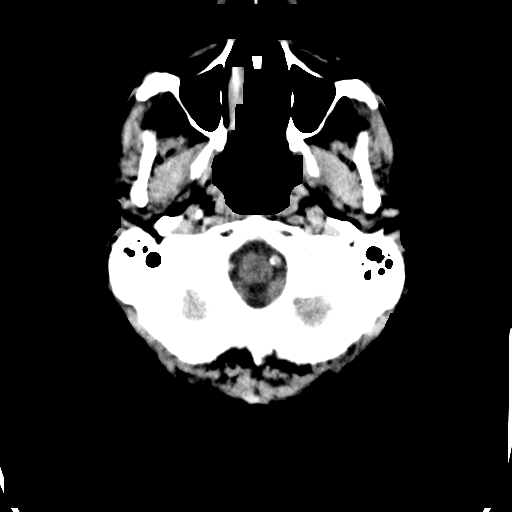
[im 3/32  bone]
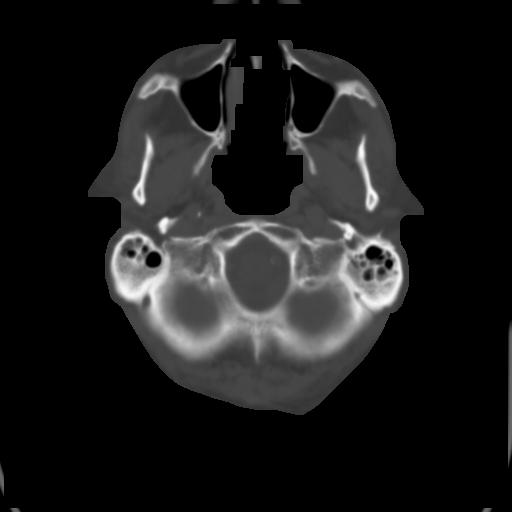
[im 7/32  brain]
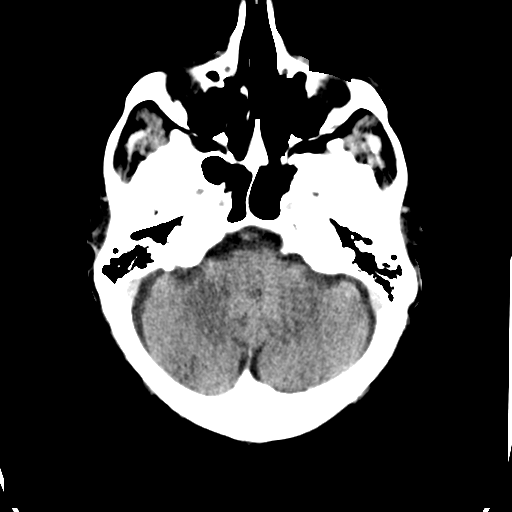
[im 10/32  brain]
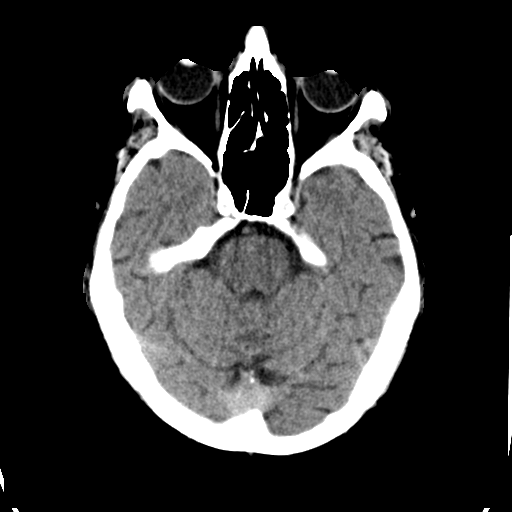
[im 14/32  brain]
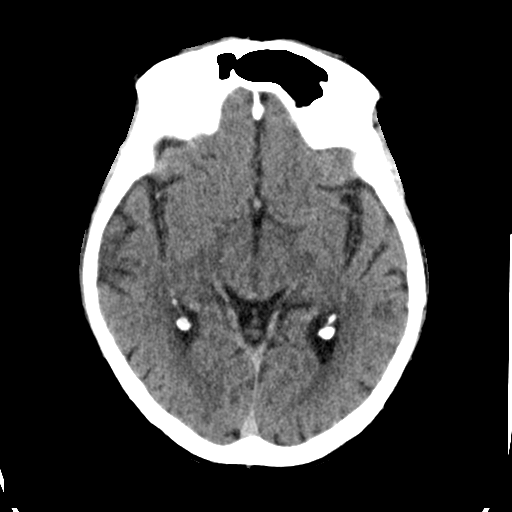
[im 18/32  brain]
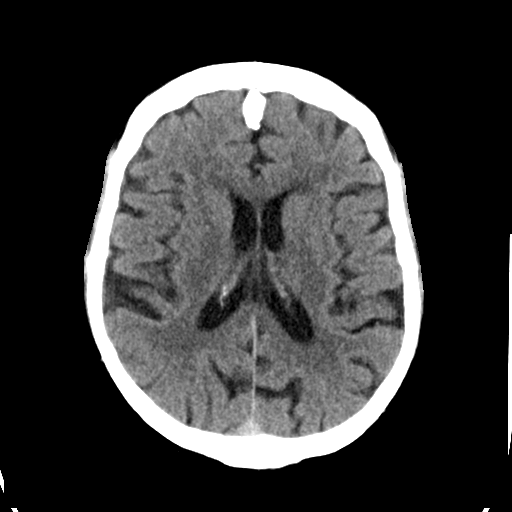
[im 18/32  bone]
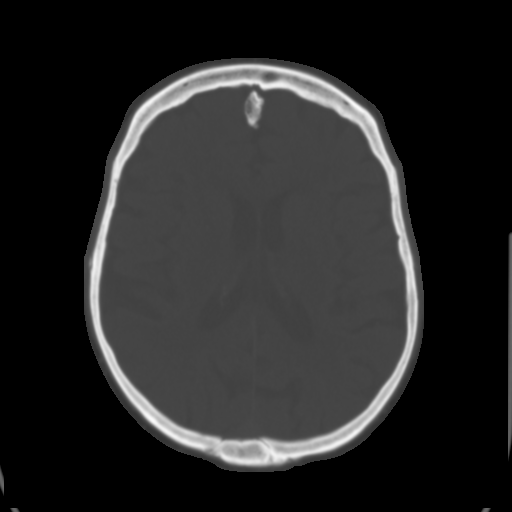
[im 22/32  brain]
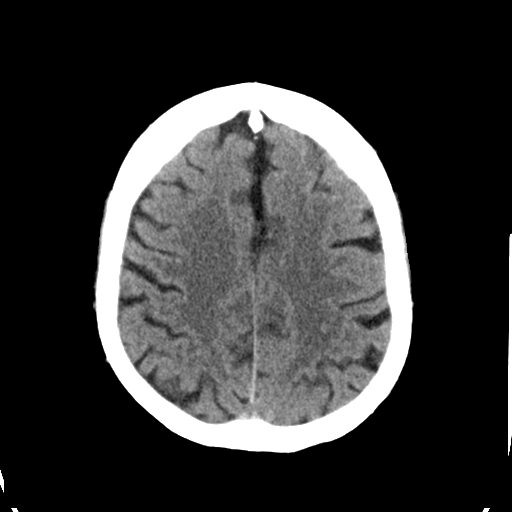
[im 25/32  brain]
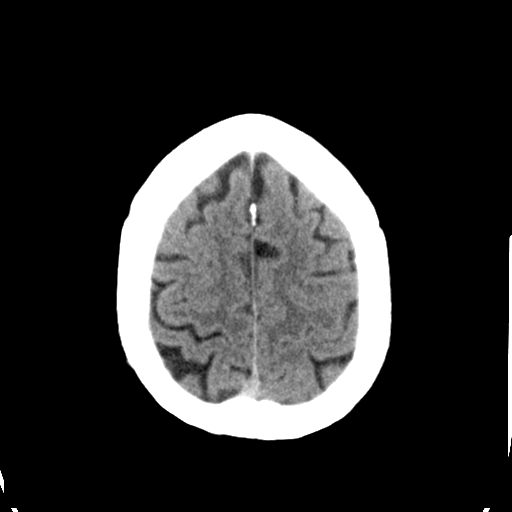
[im 29/32  brain]
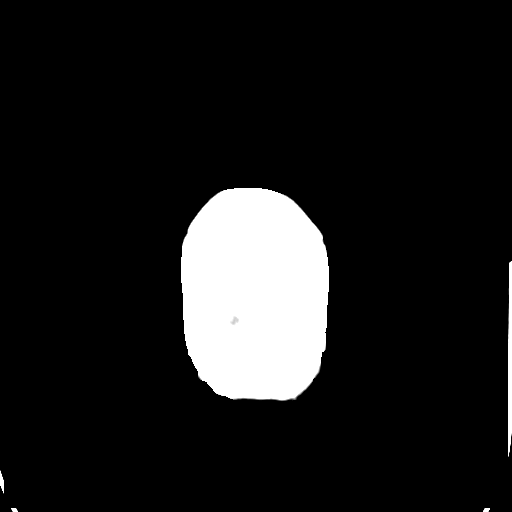

[Series 5: head 3.0 sag st · sagittal · 0.32mm/px · 3 of 64 slices shown]
[im 22/64  brain]
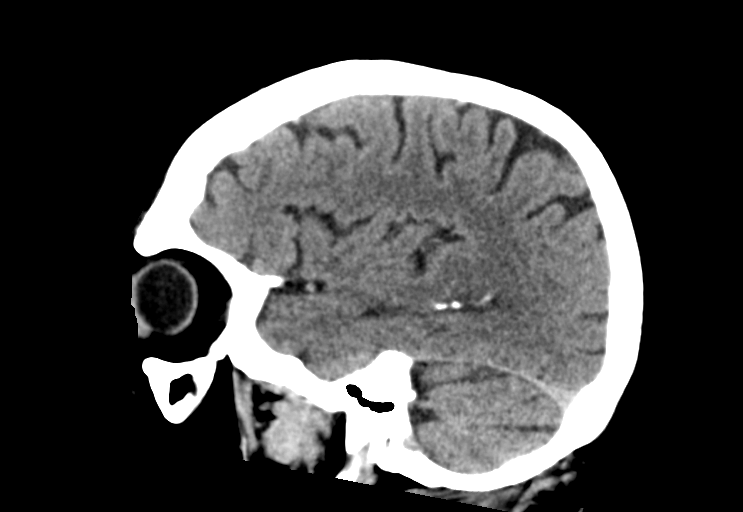
[im 32/64  brain]
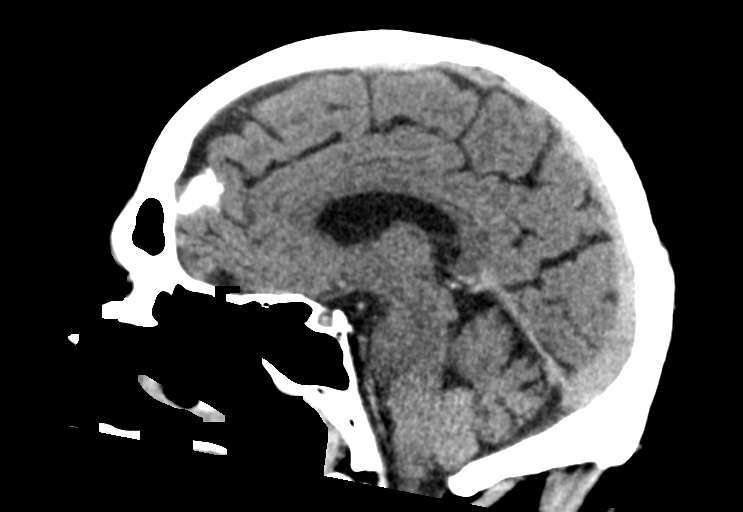
[im 43/64  brain]
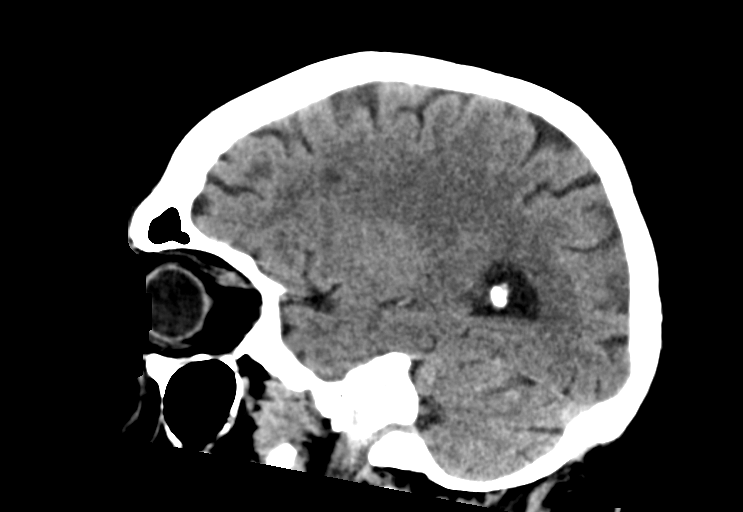

[Series 6: head 3.0 cor st · coronal · 0.30mm/px · 3 of 67 slices shown]
[im 23/67  brain]
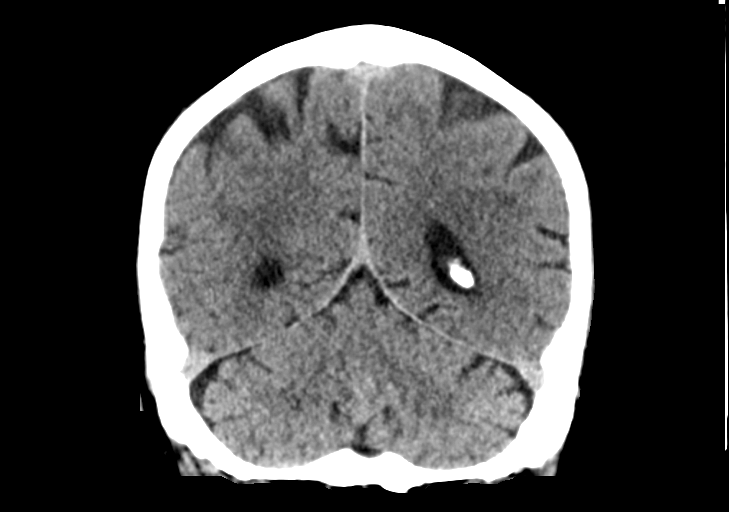
[im 30/67  brain]
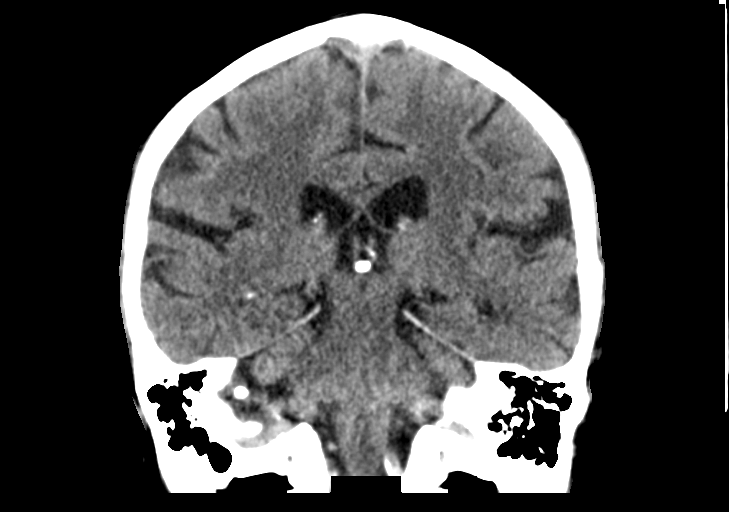
[im 37/67  brain]
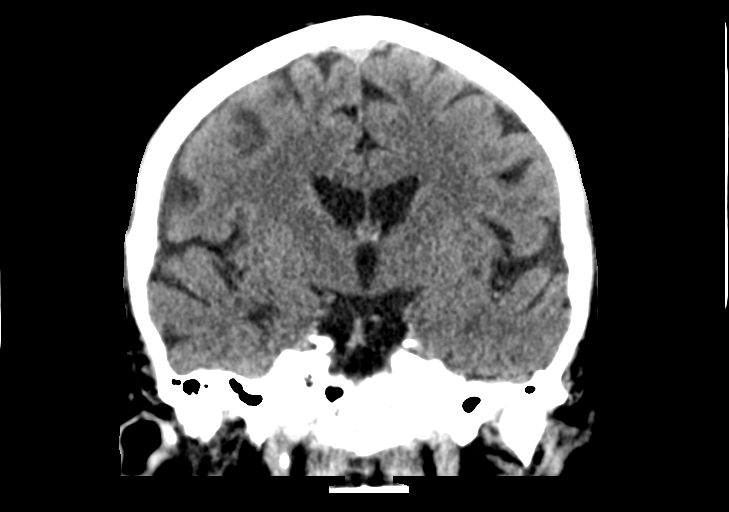

[14 of 47 positions shown; findings below may reference images not displayed]

FINDINGS: Brain: No evidence for acute infarction, hemorrhage, mass lesion,
hydrocephalus, or extra-axial fluid. Normal for age cerebral volume.
Hypoattenuation of white matter, likely small vessel disease.

Vascular: Calcification of the cavernous internal carotid arteries
consistent with cerebrovascular atherosclerotic disease. No signs of
intracranial large vessel occlusion.

Skull: Normal. Negative for fracture or focal lesion.

Sinuses/Orbits: No acute finding.

Other: None.

ASPECTS ([facility] Stroke Program Early CT Score)

- Ganglionic level infarction (caudate, lentiform nuclei, internal
capsule, insula, M1-M3 cortex): 7

- Supraganglionic infarction (M4-M6 cortex): 3

Total score (0-10 with 10 being normal): 10
IMPRESSION: 1. Atrophy and small vessel disease. No acute intracranial findings.
2. ASPECTS is 10.
These results were communicated to stroke neurologist at [DATE] pmon
02/24/2017by text page via the [employer] messaging system.

## 2019-12-02 DIAGNOSIS — L299 Pruritus, unspecified: Secondary | ICD-10-CM | POA: Diagnosis not present

## 2019-12-02 DIAGNOSIS — L300 Nummular dermatitis: Secondary | ICD-10-CM | POA: Diagnosis not present

## 2019-12-18 DIAGNOSIS — K219 Gastro-esophageal reflux disease without esophagitis: Secondary | ICD-10-CM | POA: Diagnosis not present

## 2019-12-18 DIAGNOSIS — R1310 Dysphagia, unspecified: Secondary | ICD-10-CM | POA: Diagnosis not present

## 2019-12-18 DIAGNOSIS — K2270 Barrett's esophagus without dysplasia: Secondary | ICD-10-CM | POA: Diagnosis not present

## 2019-12-30 DIAGNOSIS — Z20822 Contact with and (suspected) exposure to covid-19: Secondary | ICD-10-CM | POA: Diagnosis not present

## 2020-01-06 DIAGNOSIS — K449 Diaphragmatic hernia without obstruction or gangrene: Secondary | ICD-10-CM | POA: Diagnosis not present

## 2020-01-06 DIAGNOSIS — K219 Gastro-esophageal reflux disease without esophagitis: Secondary | ICD-10-CM | POA: Diagnosis not present

## 2020-01-06 DIAGNOSIS — Z8719 Personal history of other diseases of the digestive system: Secondary | ICD-10-CM | POA: Diagnosis not present

## 2020-01-06 DIAGNOSIS — K2270 Barrett's esophagus without dysplasia: Secondary | ICD-10-CM | POA: Diagnosis not present

## 2020-01-06 DIAGNOSIS — K208 Other esophagitis without bleeding: Secondary | ICD-10-CM | POA: Diagnosis not present

## 2020-01-06 DIAGNOSIS — K229 Disease of esophagus, unspecified: Secondary | ICD-10-CM | POA: Diagnosis not present

## 2020-01-06 DIAGNOSIS — I10 Essential (primary) hypertension: Secondary | ICD-10-CM | POA: Diagnosis not present

## 2020-03-10 DIAGNOSIS — N1831 Chronic kidney disease, stage 3a: Secondary | ICD-10-CM | POA: Diagnosis not present

## 2020-03-10 DIAGNOSIS — E782 Mixed hyperlipidemia: Secondary | ICD-10-CM | POA: Diagnosis not present

## 2020-03-10 DIAGNOSIS — E1122 Type 2 diabetes mellitus with diabetic chronic kidney disease: Secondary | ICD-10-CM | POA: Diagnosis not present

## 2020-03-10 DIAGNOSIS — I129 Hypertensive chronic kidney disease with stage 1 through stage 4 chronic kidney disease, or unspecified chronic kidney disease: Secondary | ICD-10-CM | POA: Diagnosis not present

## 2020-03-10 DIAGNOSIS — Z79899 Other long term (current) drug therapy: Secondary | ICD-10-CM | POA: Diagnosis not present

## 2020-03-10 DIAGNOSIS — Z8546 Personal history of malignant neoplasm of prostate: Secondary | ICD-10-CM | POA: Diagnosis not present

## 2020-03-10 DIAGNOSIS — Z0000 Encounter for general adult medical examination without abnormal findings: Secondary | ICD-10-CM | POA: Diagnosis not present

## 2020-03-10 DIAGNOSIS — D696 Thrombocytopenia, unspecified: Secondary | ICD-10-CM | POA: Diagnosis not present

## 2020-03-10 DIAGNOSIS — M8949 Other hypertrophic osteoarthropathy, multiple sites: Secondary | ICD-10-CM | POA: Diagnosis not present

## 2020-03-10 DIAGNOSIS — K22710 Barrett's esophagus with low grade dysplasia: Secondary | ICD-10-CM | POA: Diagnosis not present

## 2020-03-10 DIAGNOSIS — K219 Gastro-esophageal reflux disease without esophagitis: Secondary | ICD-10-CM | POA: Diagnosis not present

## 2020-03-11 DIAGNOSIS — N1831 Chronic kidney disease, stage 3a: Secondary | ICD-10-CM | POA: Diagnosis not present

## 2020-03-11 DIAGNOSIS — Z8546 Personal history of malignant neoplasm of prostate: Secondary | ICD-10-CM | POA: Diagnosis not present

## 2020-03-11 DIAGNOSIS — D696 Thrombocytopenia, unspecified: Secondary | ICD-10-CM | POA: Diagnosis not present

## 2020-03-11 DIAGNOSIS — I10 Essential (primary) hypertension: Secondary | ICD-10-CM | POA: Diagnosis not present

## 2020-03-11 DIAGNOSIS — E782 Mixed hyperlipidemia: Secondary | ICD-10-CM | POA: Diagnosis not present

## 2020-03-11 DIAGNOSIS — E1122 Type 2 diabetes mellitus with diabetic chronic kidney disease: Secondary | ICD-10-CM | POA: Diagnosis not present

## 2020-03-11 DIAGNOSIS — Z79899 Other long term (current) drug therapy: Secondary | ICD-10-CM | POA: Diagnosis not present

## 2020-06-01 DIAGNOSIS — D485 Neoplasm of uncertain behavior of skin: Secondary | ICD-10-CM | POA: Diagnosis not present

## 2020-06-01 DIAGNOSIS — L299 Pruritus, unspecified: Secondary | ICD-10-CM | POA: Diagnosis not present

## 2020-06-01 DIAGNOSIS — L300 Nummular dermatitis: Secondary | ICD-10-CM | POA: Diagnosis not present

## 2020-10-22 DIAGNOSIS — H25813 Combined forms of age-related cataract, bilateral: Secondary | ICD-10-CM | POA: Diagnosis not present

## 2020-10-28 DIAGNOSIS — Z8546 Personal history of malignant neoplasm of prostate: Secondary | ICD-10-CM | POA: Diagnosis not present

## 2020-10-28 DIAGNOSIS — R5383 Other fatigue: Secondary | ICD-10-CM | POA: Diagnosis not present

## 2020-10-28 DIAGNOSIS — L989 Disorder of the skin and subcutaneous tissue, unspecified: Secondary | ICD-10-CM | POA: Diagnosis not present

## 2020-10-28 DIAGNOSIS — D696 Thrombocytopenia, unspecified: Secondary | ICD-10-CM | POA: Diagnosis not present

## 2020-10-28 DIAGNOSIS — I10 Essential (primary) hypertension: Secondary | ICD-10-CM | POA: Diagnosis not present

## 2020-10-28 DIAGNOSIS — I129 Hypertensive chronic kidney disease with stage 1 through stage 4 chronic kidney disease, or unspecified chronic kidney disease: Secondary | ICD-10-CM | POA: Diagnosis not present

## 2020-10-28 DIAGNOSIS — E782 Mixed hyperlipidemia: Secondary | ICD-10-CM | POA: Diagnosis not present

## 2020-10-28 DIAGNOSIS — R7303 Prediabetes: Secondary | ICD-10-CM | POA: Diagnosis not present

## 2020-10-28 DIAGNOSIS — N1831 Chronic kidney disease, stage 3a: Secondary | ICD-10-CM | POA: Diagnosis not present

## 2020-10-28 DIAGNOSIS — R5381 Other malaise: Secondary | ICD-10-CM | POA: Diagnosis not present

## 2020-10-28 DIAGNOSIS — K219 Gastro-esophageal reflux disease without esophagitis: Secondary | ICD-10-CM | POA: Diagnosis not present

## 2020-12-03 DIAGNOSIS — L299 Pruritus, unspecified: Secondary | ICD-10-CM | POA: Diagnosis not present

## 2020-12-03 DIAGNOSIS — L853 Xerosis cutis: Secondary | ICD-10-CM | POA: Diagnosis not present

## 2020-12-03 DIAGNOSIS — L300 Nummular dermatitis: Secondary | ICD-10-CM | POA: Diagnosis not present

## 2020-12-21 DIAGNOSIS — Z23 Encounter for immunization: Secondary | ICD-10-CM | POA: Diagnosis not present

## 2021-02-09 DIAGNOSIS — Z8719 Personal history of other diseases of the digestive system: Secondary | ICD-10-CM | POA: Diagnosis not present

## 2021-02-09 DIAGNOSIS — R1310 Dysphagia, unspecified: Secondary | ICD-10-CM | POA: Diagnosis not present

## 2021-02-16 DIAGNOSIS — R1310 Dysphagia, unspecified: Secondary | ICD-10-CM | POA: Diagnosis not present

## 2024-03-21 ENCOUNTER — Other Ambulatory Visit (HOSPITAL_BASED_OUTPATIENT_CLINIC_OR_DEPARTMENT_OTHER): Payer: Self-pay

## 2024-03-21 MED ORDER — OMEPRAZOLE 20 MG PO CPDR: 20.0000 mg | DELAYED_RELEASE_CAPSULE | Freq: Every day | ORAL | 3 refills | Status: AC

## 2024-03-21 MED ORDER — HYDROCHLOROTHIAZIDE 12.5 MG PO CAPS: 12.5000 mg | ORAL_CAPSULE | Freq: Every morning | ORAL | 3 refills | Status: AC

## 2024-03-21 MED ORDER — VALSARTAN 160 MG PO TABS: 160.0000 mg | ORAL_TABLET | Freq: Every day | ORAL | 3 refills | Status: AC

## 2024-03-24 ENCOUNTER — Other Ambulatory Visit (HOSPITAL_BASED_OUTPATIENT_CLINIC_OR_DEPARTMENT_OTHER): Payer: Self-pay
